# Patient Record
Sex: Female | Born: 1955 | Race: White | Hispanic: No | Marital: Single | Smoking: Never smoker
Health system: Southern US, Community
[De-identification: ages and names within clinical notes are randomized; demographics above are authoritative.]

## PROBLEM LIST (undated history)

## (undated) DIAGNOSIS — I10 Essential (primary) hypertension: Secondary | ICD-10-CM

## (undated) DIAGNOSIS — N200 Calculus of kidney: Secondary | ICD-10-CM

## (undated) HISTORY — PX: BREAST SURGERY: SHX581

## (undated) HISTORY — PX: TUBAL LIGATION: SHX77

---

## 1998-05-15 ENCOUNTER — Other Ambulatory Visit: Admission: RE | Admit: 1998-05-15 | Discharge: 1998-05-15 | Payer: Self-pay | Admitting: Obstetrics and Gynecology

## 1998-09-15 ENCOUNTER — Ambulatory Visit (HOSPITAL_COMMUNITY): Admission: RE | Admit: 1998-09-15 | Discharge: 1998-09-15 | Payer: Self-pay | Admitting: *Deleted

## 1998-10-08 ENCOUNTER — Ambulatory Visit (HOSPITAL_BASED_OUTPATIENT_CLINIC_OR_DEPARTMENT_OTHER): Admission: RE | Admit: 1998-10-08 | Discharge: 1998-10-08 | Payer: Self-pay | Admitting: *Deleted

## 1998-10-15 ENCOUNTER — Ambulatory Visit (HOSPITAL_COMMUNITY): Admission: RE | Admit: 1998-10-15 | Discharge: 1998-10-15 | Payer: Self-pay | Admitting: *Deleted

## 2000-04-11 ENCOUNTER — Other Ambulatory Visit: Admission: RE | Admit: 2000-04-11 | Discharge: 2000-04-11 | Payer: Self-pay | Admitting: Obstetrics and Gynecology

## 2000-11-22 ENCOUNTER — Other Ambulatory Visit: Admission: RE | Admit: 2000-11-22 | Discharge: 2000-11-22 | Payer: Self-pay | Admitting: Obstetrics and Gynecology

## 2003-01-08 ENCOUNTER — Ambulatory Visit (HOSPITAL_COMMUNITY): Admission: RE | Admit: 2003-01-08 | Discharge: 2003-01-08 | Payer: Self-pay | Admitting: Plastic Surgery

## 2003-01-08 ENCOUNTER — Encounter: Payer: Self-pay | Admitting: Plastic Surgery

## 2004-01-01 ENCOUNTER — Emergency Department (HOSPITAL_COMMUNITY): Admission: EM | Admit: 2004-01-01 | Discharge: 2004-01-01 | Payer: Self-pay | Admitting: Emergency Medicine

## 2007-11-10 ENCOUNTER — Emergency Department (HOSPITAL_COMMUNITY): Admission: EM | Admit: 2007-11-10 | Discharge: 2007-11-10 | Payer: Self-pay | Admitting: Emergency Medicine

## 2007-11-19 ENCOUNTER — Emergency Department (HOSPITAL_COMMUNITY): Admission: EM | Admit: 2007-11-19 | Discharge: 2007-11-19 | Payer: Self-pay | Admitting: Emergency Medicine

## 2012-11-17 ENCOUNTER — Encounter (HOSPITAL_BASED_OUTPATIENT_CLINIC_OR_DEPARTMENT_OTHER): Payer: Self-pay | Admitting: Emergency Medicine

## 2012-11-17 ENCOUNTER — Emergency Department (HOSPITAL_BASED_OUTPATIENT_CLINIC_OR_DEPARTMENT_OTHER)
Admission: EM | Admit: 2012-11-17 | Discharge: 2012-11-18 | Disposition: A | Discharge status: Home / Self Care | Payer: BC Managed Care – PPO | Attending: Emergency Medicine | Admitting: Emergency Medicine

## 2012-11-17 DIAGNOSIS — N390 Urinary tract infection, site not specified: Secondary | ICD-10-CM

## 2012-11-17 DIAGNOSIS — R1110 Vomiting, unspecified: Secondary | ICD-10-CM | POA: Insufficient documentation

## 2012-11-17 DIAGNOSIS — N23 Unspecified renal colic: Secondary | ICD-10-CM | POA: Insufficient documentation

## 2012-11-17 DIAGNOSIS — I10 Essential (primary) hypertension: Secondary | ICD-10-CM | POA: Insufficient documentation

## 2012-11-17 DIAGNOSIS — Z79899 Other long term (current) drug therapy: Secondary | ICD-10-CM | POA: Insufficient documentation

## 2012-11-17 HISTORY — DX: Essential (primary) hypertension: I10

## 2012-11-17 LAB — URINALYSIS, ROUTINE W REFLEX MICROSCOPIC
Glucose, UA: NEGATIVE mg/dL
Ketones, ur: NEGATIVE mg/dL
Nitrite: NEGATIVE
Protein, ur: 30 mg/dL — AB
Specific Gravity, Urine: 1.031 — ABNORMAL HIGH (ref 1.005–1.030)
Urobilinogen, UA: 0.2 mg/dL (ref 0.0–1.0)
pH: 5.5 (ref 5.0–8.0)

## 2012-11-17 NOTE — ED Notes (Signed)
Pt c/o Rt sided flank pain radiating around to RLQ onset tonight. Pt also c/o nausea and vomiting.

## 2012-11-18 ENCOUNTER — Emergency Department (HOSPITAL_BASED_OUTPATIENT_CLINIC_OR_DEPARTMENT_OTHER): Payer: BC Managed Care – PPO

## 2012-11-18 MED ORDER — TAMSULOSIN HCL 0.4 MG PO CAPS
0.4000 mg | ORAL_CAPSULE | Freq: Once | ORAL | Status: AC
  Administered 2012-11-18: 0.4 mg via ORAL
  Filled 2012-11-18: qty 1

## 2012-11-18 MED ORDER — CIPROFLOXACIN HCL 500 MG PO TABS
500.0000 mg | ORAL_TABLET | Freq: Once | ORAL | Status: AC
  Administered 2012-11-18: 500 mg via ORAL
  Filled 2012-11-18: qty 1

## 2012-11-18 MED ORDER — HYDROMORPHONE HCL PF 1 MG/ML IJ SOLN
1.0000 mg | Freq: Once | INTRAMUSCULAR | Status: AC
  Administered 2012-11-18: 1 mg via INTRAVENOUS
  Filled 2012-11-18: qty 1

## 2012-11-18 MED ORDER — DEXTROSE 5 % IV SOLN
1.0000 g | INTRAVENOUS | Status: DC
  Administered 2012-11-18: 1 g via INTRAVENOUS
  Filled 2012-11-18: qty 10

## 2012-11-18 MED ORDER — CIPROFLOXACIN HCL 500 MG PO TABS: 500.0000 mg | ORAL_TABLET | Freq: Two times a day (BID) | ORAL | Status: AC

## 2012-11-18 MED ORDER — TAMSULOSIN HCL 0.4 MG PO CAPS: ORAL_CAPSULE | ORAL | Status: DC

## 2012-11-18 MED ORDER — SODIUM CHLORIDE 0.9 % IV SOLN
INTRAVENOUS | Status: DC
  Administered 2012-11-18: 01:00:00 via INTRAVENOUS

## 2012-11-18 MED ORDER — NAPROXEN SODIUM 220 MG PO TABS: ORAL_TABLET | ORAL | Status: DC

## 2012-11-18 MED ORDER — ONDANSETRON HCL 4 MG/2ML IJ SOLN
4.0000 mg | Freq: Once | INTRAMUSCULAR | Status: AC
  Administered 2012-11-18: 4 mg via INTRAVENOUS
  Filled 2012-11-18: qty 2

## 2012-11-18 MED ORDER — HYDROMORPHONE HCL 2 MG PO TABS: 2.0000 mg | ORAL_TABLET | ORAL | Status: DC | PRN

## 2012-11-18 MED ORDER — KETOROLAC TROMETHAMINE 15 MG/ML IJ SOLN
15.0000 mg | Freq: Once | INTRAMUSCULAR | Status: AC
  Administered 2012-11-18: 15 mg via INTRAVENOUS
  Filled 2012-11-18: qty 1

## 2012-11-18 NOTE — ED Notes (Signed)
Pt's acuity increased d/t pain and nausea. MD aware and will see pt next.

## 2012-11-18 NOTE — ED Provider Notes (Addendum)
History     CSN: [id number]  Arrival date & time 11/17/12  2322   First MD Initiated Contact with Patient 11/18/12 0043      Chief Complaint  Patient presents with  . Flank Pain    (Consider location/radiation/quality/duration/timing/severity/associated sxs/prior treatment) HPI This is a 57 year old female who had the sudden onset of right flank pain about 9:30 yesterday evening. The pain is described as aching and like menstrual cramps. It has been moderate to severe at its worst. It has been colicky. It is not as severe it is normal. It radiates to the right lower quadrant of the abdomen, often alternating locations. It is not worse with movement or palpation and is not improved with rest. It is been associated with vomiting but no nausea. She denies fever, chills, dysuria or hematuria. She has no history of nephrolithiasis.  Past Medical History  Diagnosis Date  . Hypertension     History reviewed. No pertinent past surgical history.  No family history on file.  History  Substance Use Topics  . Smoking status: Never Smoker   . Smokeless tobacco: Not on file  . Alcohol Use: No    OB History    Grav Para Term Preterm Abortions TAB SAB Ect Mult Living                  Review of Systems  All other systems reviewed and are negative.    Allergies  Review of patient's allergies indicates no known allergies.  Home Medications   Current Outpatient Rx  Name  Route  Sig  Dispense  Refill  . PHENTERMINE HCL 37.5 MG PO CAPS   Oral   Take 14 mg by mouth as needed.         Marland Kitchen VALSARTAN-HYDROCHLOROTHIAZIDE 320-12.5 MG PO TABS   Oral   Take 1 tablet by mouth daily.           BP 131/101  Pulse 95  Temp 98.6 F (37 C) (Oral)  Resp 18  Ht 5\' 7"  (1.702 m)  Wt 205 lb (92.987 kg)  BMI 32.11 kg/m2  SpO2 98%  Physical Exam General: Well-developed, well-nourished female in no acute distress; appearance consistent with age of record HENT: normocephalic,  atraumatic Eyes: pupils equal round and reactive to light; extraocular muscles intact Neck: supple Heart: regular rate and rhythm; no murmurs, rubs or gallops Lungs: clear to auscultation bilaterally Abdomen: soft; nondistended; nontender; no masses or hepatosplenomegaly; bowel sounds present GU: No flank tenderness Extremities: No deformity; full range of motion; pulses normal Neurologic: Awake, alert and oriented; motor function intact in all extremities and symmetric; no facial droop Skin: Warm and dry Psychiatric: Normal mood and affect    ED Course  Procedures (including critical care time)     MDM   Nursing notes and vitals signs, including pulse oximetry, reviewed.  Summary of this visit's results, reviewed by myself:  Labs:  Results for orders placed during the hospital encounter of 11/17/12 (from the past 24 hour(s))  URINALYSIS, ROUTINE W REFLEX MICROSCOPIC     Status: Abnormal   Collection Time   11/17/12 11:28 PM      Component Value Range   Color, Urine YELLOW  YELLOW   APPearance CLOUDY (*) CLEAR   Specific Gravity, Urine 1.031 (*) 1.005 - 1.030   pH 5.5  5.0 - 8.0   Glucose, UA NEGATIVE  NEGATIVE mg/dL   Hgb urine dipstick LARGE (*) NEGATIVE   Bilirubin Urine SMALL (*) NEGATIVE  Ketones, ur NEGATIVE  NEGATIVE mg/dL   Protein, ur 30 (*) NEGATIVE mg/dL   Urobilinogen, UA 0.2  0.0 - 1.0 mg/dL   Nitrite NEGATIVE  NEGATIVE   Leukocytes, UA LARGE (*) NEGATIVE  URINE MICROSCOPIC-ADD ON     Status: Abnormal   Collection Time   11/17/12 11:28 PM      Component Value Range   Squamous Epithelial / LPF FEW (*) RARE   WBC, UA 21-50  <3 WBC/hpf   RBC / HPF 21-50  <3 RBC/hpf   Bacteria, UA MANY (*) RARE    Imaging Studies: Ct Abdomen Pelvis Wo Contrast  11/18/2012  *RADIOLOGY REPORT*  Clinical Data: Right flank pain  CT ABDOMEN AND PELVIS WITHOUT CONTRAST  Technique:  Multidetector CT imaging of the abdomen and pelvis was performed following the standard  protocol without intravenous contrast.  Comparison: none  Findings: Lung bases are clear.  No pericardial fluid.  Non-IV contrast images demonstrate no focal hepatic lesion.  The gallbladder, pancreas, spleen, adrenal glands are normal.  There is mild pelvicaliectasis on the right and right hydroureter. This is secondary to a partially obstructing calculus in the proximal right ureter measuring 4 mm (image 47).  This right ureteral calculus is at the L4-L5 disc space level and is  faintly evident on the CT scout.  There is several 2 mm calculi within the left kidney.  No left ureteral lithiasis. Tiny 1 mm right renal calculus.  The stomach, small bowel, appendix, and cecum are normal.  Colon rectosigmoid colon are normal.  Abdominal aorta is normal caliber.  No retroperitoneal periportal lymphadenopathy.  No free fluid the pelvis.  No distal ureteral stones bladder stones.  The uterus and ovaries are normal.  No pelvic lymphadenopathy.  IMPRESSION:  1.  Partial obstructing calculus within the mid right ureter with mild obstructive uropathy. 2.  Punctate bilateral nephrolithiasis.   Original Report Authenticated By: Genevive Bi, M.D.    2:11 AM Pain well controlled with IV medications.  Discussed with Dr. Brunilda Payor. He advises Flomax and ciprofloxacin. He will see the patient in his office; she is to call Monday morning.         Hanley Seamen, MD 11/18/12 1610  Hanley Seamen, MD 11/18/12 9604

## 2012-11-19 LAB — URINE CULTURE

## 2012-12-06 ENCOUNTER — Encounter (HOSPITAL_BASED_OUTPATIENT_CLINIC_OR_DEPARTMENT_OTHER): Payer: Self-pay | Admitting: *Deleted

## 2012-12-06 ENCOUNTER — Emergency Department (HOSPITAL_BASED_OUTPATIENT_CLINIC_OR_DEPARTMENT_OTHER)
Admission: EM | Admit: 2012-12-06 | Discharge: 2012-12-06 | Disposition: A | Discharge status: Home / Self Care | Payer: BC Managed Care – PPO | Attending: Emergency Medicine | Admitting: Emergency Medicine

## 2012-12-06 DIAGNOSIS — Z79899 Other long term (current) drug therapy: Secondary | ICD-10-CM | POA: Insufficient documentation

## 2012-12-06 DIAGNOSIS — N200 Calculus of kidney: Secondary | ICD-10-CM

## 2012-12-06 DIAGNOSIS — Z87442 Personal history of urinary calculi: Secondary | ICD-10-CM | POA: Insufficient documentation

## 2012-12-06 DIAGNOSIS — I10 Essential (primary) hypertension: Secondary | ICD-10-CM | POA: Insufficient documentation

## 2012-12-06 HISTORY — DX: Calculus of kidney: N20.0

## 2012-12-06 LAB — URINE MICROSCOPIC-ADD ON

## 2012-12-06 LAB — URINALYSIS, ROUTINE W REFLEX MICROSCOPIC
Ketones, ur: 15 mg/dL — AB
Nitrite: NEGATIVE
Specific Gravity, Urine: 1.022 (ref 1.005–1.030)
Urobilinogen, UA: 0.2 mg/dL (ref 0.0–1.0)
pH: 5.5 (ref 5.0–8.0)

## 2012-12-06 MED ORDER — CIPROFLOXACIN HCL 500 MG PO TABS: 500.0000 mg | ORAL_TABLET | Freq: Two times a day (BID) | ORAL | Status: AC

## 2012-12-06 NOTE — ED Provider Notes (Signed)
History     CSN: [id number]  Arrival date & time 12/06/12  1326   First MD Initiated Contact with Patient 12/06/12 1338      Chief Complaint  Patient presents with  . Flank Pain    (Consider location/radiation/quality/duration/timing/severity/associated sxs/prior treatment) HPI Comments: Patient diagnosed with renal calculus 2 weeks ago here and was told to follow up with Urology.  She has tried to call but [facility] would not give her an appointment.  She has been taking her pain medications and flomax and continues to have the discomfort.  She presents here because she is now having dark/bloody urine and is not sure where to go and what to do.  She denies fevers or chills.  No v/d.  Patient is a 57 y.o. female presenting with flank pain. The history is provided by the patient.  Flank Pain This is a new problem. Episode onset: 2 weeks ago. Episode frequency: intermittently. The problem has been gradually worsening. Nothing aggravates the symptoms. Nothing relieves the symptoms.    Past Medical History  Diagnosis Date  . Hypertension   . Kidney calculus     History reviewed. No pertinent past surgical history.  History reviewed. No pertinent family history.  History  Substance Use Topics  . Smoking status: Never Smoker   . Smokeless tobacco: Not on file  . Alcohol Use: No    OB History    Grav Para Term Preterm Abortions TAB SAB Ect Mult Living                  Review of Systems  Genitourinary: Positive for flank pain.  All other systems reviewed and are negative.    Allergies  Review of patient's allergies indicates no known allergies.  Home Medications   Current Outpatient Rx  Name  Route  Sig  Dispense  Refill  . CIPROFLOXACIN HCL 500 MG PO TABS   Oral   Take 1 tablet (500 mg total) by mouth 2 (two) times daily.   14 tablet   0   . HYDROMORPHONE HCL 2 MG PO TABS   Oral   Take 1 tablet (2 mg total) by mouth every 4 (four) hours as needed for pain.  30 tablet   0   . NAPROXEN SODIUM 220 MG PO TABS      Take 2 tablets twice daily until stone passes.         Marland Kitchen PHENTERMINE HCL 37.5 MG PO CAPS   Oral   Take 14 mg by mouth as needed.         Marland Kitchen TAMSULOSIN HCL 0.4 MG PO CAPS      Take 1 capsule daily until stone passes.   30 capsule   0   . VALSARTAN-HYDROCHLOROTHIAZIDE 320-12.5 MG PO TABS   Oral   Take 1 tablet by mouth daily.           BP 145/97  Pulse 94  Temp 97.6 F (36.4 C) (Oral)  Resp 16  Ht 5\' 7"  (1.702 m)  Wt 204 lb (92.534 kg)  BMI 31.95 kg/m2  SpO2 100%  Physical Exam  Nursing note and vitals reviewed. Constitutional: She is oriented to person, place, and time. She appears well-developed and well-nourished. No distress.  HENT:  Head: Normocephalic and atraumatic.  Neck: Normal range of motion. Neck supple.  Cardiovascular: Normal rate and regular rhythm.  Exam reveals no gallop and no friction rub.   No murmur heard. Pulmonary/Chest: Effort normal and breath sounds normal.  No respiratory distress. She has no wheezes.  Abdominal: Soft. Bowel sounds are normal. She exhibits no distension. There is no tenderness.       There is mild right-sided cva ttp.    Musculoskeletal: Normal range of motion.  Neurological: She is alert and oriented to person, place, and time.  Skin: Skin is warm and dry. She is not diaphoretic.    ED Course  Procedures (including critical care time)   Labs Reviewed  URINALYSIS, ROUTINE W REFLEX MICROSCOPIC   No results found.   No diagnosis found.    MDM  I have spoken with Dr. Laverle Patter from Urology about this patient.  He would like to have her followed up in the next 2 days to be seen.  I have also spoken with Zella Ball at [facility] who will make arrangements for this appointment.  Mrs. Ebarb is to call her tomorrow.          Geoffery Lyons, MD 12/06/12 1451

## 2012-12-06 NOTE — ED Notes (Signed)
Pt c/o right flank pain x 1 day seen here 2 weeks ago for same dx kidney stones

## 2020-01-14 ENCOUNTER — Emergency Department (HOSPITAL_BASED_OUTPATIENT_CLINIC_OR_DEPARTMENT_OTHER): Payer: No Typology Code available for payment source

## 2020-01-14 ENCOUNTER — Other Ambulatory Visit: Payer: Self-pay

## 2020-01-14 ENCOUNTER — Emergency Department (HOSPITAL_BASED_OUTPATIENT_CLINIC_OR_DEPARTMENT_OTHER)
Admission: EM | Admit: 2020-01-14 | Discharge: 2020-01-14 | Disposition: A | Discharge status: Home / Self Care | Payer: No Typology Code available for payment source | Attending: Emergency Medicine | Admitting: Emergency Medicine

## 2020-01-14 ENCOUNTER — Encounter (HOSPITAL_BASED_OUTPATIENT_CLINIC_OR_DEPARTMENT_OTHER): Payer: Self-pay | Admitting: *Deleted

## 2020-01-14 DIAGNOSIS — R42 Dizziness and giddiness: Secondary | ICD-10-CM

## 2020-01-14 DIAGNOSIS — G44209 Tension-type headache, unspecified, not intractable: Secondary | ICD-10-CM | POA: Diagnosis not present

## 2020-01-14 DIAGNOSIS — R519 Headache, unspecified: Secondary | ICD-10-CM

## 2020-01-14 DIAGNOSIS — I10 Essential (primary) hypertension: Secondary | ICD-10-CM | POA: Diagnosis not present

## 2020-01-14 DIAGNOSIS — Z79899 Other long term (current) drug therapy: Secondary | ICD-10-CM | POA: Insufficient documentation

## 2020-01-14 LAB — CBC WITH DIFFERENTIAL/PLATELET
Abs Immature Granulocytes: 0.01 10*3/uL (ref 0.00–0.07)
Basophils Absolute: 0 10*3/uL (ref 0.0–0.1)
Basophils Relative: 1 %
Eosinophils Absolute: 0.2 10*3/uL (ref 0.0–0.5)
Eosinophils Relative: 4 %
HCT: 46.7 % — ABNORMAL HIGH (ref 36.0–46.0)
Hemoglobin: 15.5 g/dL — ABNORMAL HIGH (ref 12.0–15.0)
Immature Granulocytes: 0 %
Lymphocytes Relative: 28 %
Lymphs Abs: 1.6 10*3/uL (ref 0.7–4.0)
MCH: 32.7 pg (ref 26.0–34.0)
MCHC: 33.2 g/dL (ref 30.0–36.0)
MCV: 98.5 fL (ref 80.0–100.0)
Monocytes Absolute: 0.8 10*3/uL (ref 0.1–1.0)
Monocytes Relative: 15 %
Neutro Abs: 3.1 10*3/uL (ref 1.7–7.7)
Neutrophils Relative %: 52 %
Platelets: 261 10*3/uL (ref 150–400)
RBC: 4.74 MIL/uL (ref 3.87–5.11)
RDW: 12.5 % (ref 11.5–15.5)
WBC: 5.8 10*3/uL (ref 4.0–10.5)
nRBC: 0 % (ref 0.0–0.2)

## 2020-01-14 LAB — COMPREHENSIVE METABOLIC PANEL
ALT: 23 U/L (ref 0–44)
AST: 19 U/L (ref 15–41)
Albumin: 3.8 g/dL (ref 3.5–5.0)
Alkaline Phosphatase: 50 U/L (ref 38–126)
Anion gap: 7 (ref 5–15)
BUN: 17 mg/dL (ref 8–23)
CO2: 30 mmol/L (ref 22–32)
Calcium: 9.2 mg/dL (ref 8.9–10.3)
Chloride: 101 mmol/L (ref 98–111)
Creatinine, Ser: 0.99 mg/dL (ref 0.44–1.00)
GFR calc Af Amer: >60 mL/min (ref >60)
GFR calc non Af Amer: >60 mL/min (ref >60)
Glucose, Bld: 94 mg/dL (ref 70–99)
Potassium: 3.3 mmol/L — ABNORMAL LOW (ref 3.5–5.1)
Sodium: 138 mmol/L (ref 135–145)
Total Bilirubin: 1 mg/dL (ref 0.3–1.2)
Total Protein: 7 g/dL (ref 6.5–8.1)

## 2020-01-14 MED ORDER — POTASSIUM CHLORIDE CRYS ER 20 MEQ PO TBCR
40.0000 meq | EXTENDED_RELEASE_TABLET | Freq: Once | ORAL | Status: AC
  Administered 2020-01-14: 40 meq via ORAL
  Filled 2020-01-14: qty 2

## 2020-01-14 MED ORDER — SODIUM CHLORIDE 0.9 % IV BOLUS
1000.0000 mL | Freq: Once | INTRAVENOUS | Status: AC
  Administered 2020-01-14: 1000 mL via INTRAVENOUS

## 2020-01-14 MED ORDER — IOHEXOL 350 MG/ML SOLN
100.0000 mL | Freq: Once | INTRAVENOUS | Status: AC
  Administered 2020-01-14: 100 mL via INTRAVENOUS

## 2020-01-14 NOTE — Discharge Instructions (Signed)
If you develop continued, recurrent, or worsening headache, fever, neck stiffness, vomiting, blurry or double vision, weakness or numbness in your arms or legs, trouble speaking, or any other new/concerning symptoms then return to the ER for evaluation.  

## 2020-01-14 NOTE — ED Triage Notes (Signed)
States BP has been elevated for the last few days, headaches, dizziness, n/v/d, blurry vision and perf vision has been 'off'

## 2020-01-14 NOTE — ED Provider Notes (Signed)
[facility] EMERGENCY DEPARTMENT Provider Note   CSN: [id number] Arrival date & time: 01/14/20  1526     History Chief Complaint  Patient presents with  . Dizziness    Debra Mcmahon is a 64 y.o. female.  HPI 64 year old female presents with dizziness and headache.  Symptoms started on 3/6.  She woke up feeling not well including a mild headache.  Symptoms have progressively worsened.  Yesterday morning she was watching TV and noticed that it was blurry.  There was no double vision.  Lasted a couple hours.  Headache has progressively worsened and is diffuse.  She does have some morning congestion.  No cough, shortness of breath.  Feels like her neck hurts a little bit but no stiffness.  She has also felt dizzy which she describes as a feeling of faintness.  This is a diffuse and constant feeling.  Does not feel like things are moving or she is off balance.  She vomited last night, but now has no nausea.  The headache is better now and she describes it as mild.  No chest pain.  No weakness or numbness.  No current vision changes.  Felt like it might be her blood pressure because she has gotten headaches before with blood pressure and when she checked her blood pressure throughout the weekend it had gotten to a max of 156 systolic which is high for her.   Past Medical History:  Diagnosis Date  . Hypertension   . Kidney calculus     There are no problems to display for this patient.   Past Surgical History:  Procedure Laterality Date  . BREAST SURGERY    . CESAREAN SECTION    . TUBAL LIGATION       OB History   No obstetric history on file.     History reviewed. No pertinent family history.  Social History   Tobacco Use  . Smoking status: Never Smoker  . Smokeless tobacco: Never Used  Substance Use Topics  . Alcohol use: Yes  . Drug use: No    Home Medications Prior to Admission medications   Medication Sig Start Date End Date Taking? Authorizing Provider   calcium-vitamin D (OSCAL WITH D) 500-200 MG-UNIT per tablet Take 1 tablet by mouth daily. Does not take daily    [provider]  ciprofloxacin (CIPRO) 500 MG tablet Take 1 tablet (500 mg total) by mouth 2 (two) times daily. 11/18/12   Molpus, John, MD  ciprofloxacin (CIPRO) 500 MG tablet Take 1 tablet (500 mg total) by mouth 2 (two) times daily. One po bid x 7 days 12/06/12   Geoffery Lyons, MD  fish oil-omega-3 fatty acids 1000 MG capsule Take 1 g by mouth daily. Does not use daily    [provider]  HYDROmorphone (DILAUDID) 2 MG tablet Take 2 mg by mouth every 4 (four) hours as needed. Take with 2 aleve for pain    [provider]  Multiple Vitamins-Minerals (MULTIVITAMIN WITH MINERALS) tablet Take 1 tablet by mouth daily.    [provider]  naproxen sodium (ANAPROX) 220 MG tablet Take 440 mg by mouth 2 (two) times daily with a meal. Take every 4 hours with the hydromorphone    [provider]  Tamsulosin HCl (FLOMAX) 0.4 MG CAPS Take 0.4 mg by mouth daily after breakfast.    [provider]  valsartan-hydrochlorothiazide (DIOVAN-HCT) 320-12.5 MG per tablet Take 1 tablet by mouth daily.    [provider]  Allergies    Patient has no known allergies.  Review of Systems   Review of Systems  Constitutional: Negative for fever.  Respiratory: Negative for cough and shortness of breath.   Cardiovascular: Negative for chest pain.  Gastrointestinal: Positive for vomiting. Negative for abdominal pain.  Musculoskeletal: Negative for gait problem and neck stiffness.  Neurological: Positive for light-headedness and headaches. Negative for weakness and numbness.  All other systems reviewed and are negative.   Physical Exam Updated Vital Signs BP (!) 144/89 (BP Location: Left Arm)   Pulse 72   Temp 98.1 F (36.7 C) (Oral)   Resp 18   Ht 5\' 8"  (1.727 m)   Wt 109.3 kg   SpO2 97%   BMI 36.64 kg/m   Physical Exam Vitals and  nursing note reviewed.  Constitutional:      General: She is not in acute distress.    Appearance: She is well-developed. She is not ill-appearing or diaphoretic.  HENT:     Head: Normocephalic and atraumatic.     Right Ear: External ear normal.     Left Ear: External ear normal.     Nose: Nose normal.  Eyes:     General:        Right eye: No discharge.        Left eye: No discharge.     Extraocular Movements: Extraocular movements intact.     Pupils: Pupils are equal, round, and reactive to light.  Cardiovascular:     Rate and Rhythm: Normal rate and regular rhythm.     Heart sounds: Normal heart sounds.  Pulmonary:     Effort: Pulmonary effort is normal.     Breath sounds: Normal breath sounds.  Abdominal:     Palpations: Abdomen is soft.     Tenderness: There is no abdominal tenderness.  Musculoskeletal:     Cervical back: Normal range of motion. No rigidity.  Skin:    General: Skin is warm and dry.  Neurological:     Mental Status: She is alert.     Comments: CN 3-12 grossly intact. 5/5 strength in all 4 extremities. Grossly normal sensation. Normal finger to nose. Normal gait  Psychiatric:        Mood and Affect: Mood is not anxious.     ED Results / Procedures / Treatments   Labs (all labs ordered are listed, but only abnormal results are displayed) Labs Reviewed  COMPREHENSIVE METABOLIC PANEL - Abnormal; Notable for the following components:      Result Value   Potassium 3.3 (*)    All other components within normal limits  CBC WITH DIFFERENTIAL/PLATELET - Abnormal; Notable for the following components:   Hemoglobin 15.5 (*)    HCT 46.7 (*)    All other components within normal limits    EKG EKG Interpretation  Date/Time:  Monday January 14 2020 15:46:31 EST Ventricular Rate:  83 PR Interval:  136 QRS Duration: 80 QT Interval:  376 QTC Calculation: 441 R Axis:   23 Text Interpretation: Normal sinus rhythm Low voltage QRS Cannot rule out Anterior  infarct , age undetermined Abnormal ECG no significant change since 1999 Confirmed by 2000 [phone]) on 01/14/2020 4:08:27 PM   Radiology CT Angio Head W or Wo Contrast  Result Date: 01/14/2020 CLINICAL DATA:  Dizziness, vision change EXAM: CT ANGIOGRAPHY HEAD AND NECK TECHNIQUE: Multidetector CT imaging of the head and neck was performed using the standard protocol during bolus administration of intravenous contrast. Multiplanar CT image  reconstructions and MIPs were obtained to evaluate the vascular anatomy. Carotid stenosis measurements (when applicable) are obtained utilizing NASCET criteria, using the distal internal carotid diameter as the denominator. CONTRAST:  145mL OMNIPAQUE IOHEXOL 350 MG/ML SOLN COMPARISON:  None. FINDINGS: CT HEAD Brain: There is no acute intracranial hemorrhage, mass-effect, or edema. Gray-white differentiation is preserved. There is no extra-axial fluid collection. Ventricles and sulci are within normal limits in size and configuration. Vascular: No hyperdense vessel or unexpected calcification. Skull: Calvarium is unremarkable. Sinuses/Orbits: No acute finding. Other: None. Review of the MIP images confirms the above findings CTA NECK Aortic arch: Great vessel origins are patent. Right carotid system: Patent. No measurable stenosis or evidence dissection. Left carotid system: Patent. No measurable stenosis or evidence of dissection. Vertebral arteries: Patent and codominant. No measurable stenosis or evidence of dissection. Skeleton: Degenerative changes of the cervical spine, greatest at C5-C6 and C6-C7. Other neck: No mass or adenopathy. Upper chest: No apical lung mass. Review of the MIP images confirms the above findings CTA HEAD Anterior circulation: Intracranial internal carotid arteries are patent. Anterior and middle cerebral arteries are patent. Posterior circulation: Intracranial vertebral arteries are patent. Basilar artery is patent. Posterior cerebral  arteries are patent. Probable small caliber left posterior communicating artery. Venous sinuses: As permitted by contrast timing, patent. Review of the MIP images confirms the above findings IMPRESSION: No acute intracranial abnormality. No large vessel occlusion, hemodynamically significant stenosis, or evidence of dissection. Electronically Signed   By: Macy Mis M.D.   On: 01/14/2020 19:13   CT Angio Neck W and/or Wo Contrast  Result Date: 01/14/2020 CLINICAL DATA:  Dizziness, vision change EXAM: CT ANGIOGRAPHY HEAD AND NECK TECHNIQUE: Multidetector CT imaging of the head and neck was performed using the standard protocol during bolus administration of intravenous contrast. Multiplanar CT image reconstructions and MIPs were obtained to evaluate the vascular anatomy. Carotid stenosis measurements (when applicable) are obtained utilizing NASCET criteria, using the distal internal carotid diameter as the denominator. CONTRAST:  184mL OMNIPAQUE IOHEXOL 350 MG/ML SOLN COMPARISON:  None. FINDINGS: CT HEAD Brain: There is no acute intracranial hemorrhage, mass-effect, or edema. Gray-white differentiation is preserved. There is no extra-axial fluid collection. Ventricles and sulci are within normal limits in size and configuration. Vascular: No hyperdense vessel or unexpected calcification. Skull: Calvarium is unremarkable. Sinuses/Orbits: No acute finding. Other: None. Review of the MIP images confirms the above findings CTA NECK Aortic arch: Great vessel origins are patent. Right carotid system: Patent. No measurable stenosis or evidence dissection. Left carotid system: Patent. No measurable stenosis or evidence of dissection. Vertebral arteries: Patent and codominant. No measurable stenosis or evidence of dissection. Skeleton: Degenerative changes of the cervical spine, greatest at C5-C6 and C6-C7. Other neck: No mass or adenopathy. Upper chest: No apical lung mass. Review of the MIP images confirms the above  findings CTA HEAD Anterior circulation: Intracranial internal carotid arteries are patent. Anterior and middle cerebral arteries are patent. Posterior circulation: Intracranial vertebral arteries are patent. Basilar artery is patent. Posterior cerebral arteries are patent. Probable small caliber left posterior communicating artery. Venous sinuses: As permitted by contrast timing, patent. Review of the MIP images confirms the above findings IMPRESSION: No acute intracranial abnormality. No large vessel occlusion, hemodynamically significant stenosis, or evidence of dissection. Electronically Signed   By: Macy Mis M.D.   On: 01/14/2020 19:13    Procedures Procedures (including critical care time)  Medications Ordered in ED Medications  sodium chloride 0.9 % bolus 1,000 mL (0 mLs  Intravenous Stopped 01/14/20 1942)  potassium chloride SA (KLOR-CON) CR tablet 40 mEq (40 mEq Oral Given 01/14/20 1831)  iohexol (OMNIPAQUE) 350 MG/ML injection 100 mL (100 mLs Intravenous Contrast Given 01/14/20 1843)    ED Course  I have reviewed the triage vital signs and the nursing notes.  Pertinent labs & imaging results that were available during my care of the patient were reviewed by me and considered in my medical decision making (see chart for details).    MDM Rules/Calculators/A&P                      Patient's headache is already better.  Dizziness/faintness feels better after fluids.  Mild hypokalemia but otherwise labs reassuring.  CT angiography obtained to help rule out obvious dissection, aneurysm, etc.  However her story is more of a slowly progressive headache that is inconsistent with subarachnoid hemorrhage.  She feels better now.  She is ambulatory.  My suspicion of infarct such as cerebellar infarct is pretty low.  Offered to transfer for MRI but she declines.  I think is pretty reasonable.  Advise follow-up with PCP and return precautions. Final Clinical Impression(s) / ED Diagnoses Final  diagnoses:  Dizziness  Acute nonintractable headache, unspecified headache type    Rx / DC Orders ED Discharge Orders    None       Pricilla Loveless, MD 01/15/20 [phone]

## 2020-01-14 NOTE — ED Notes (Signed)
Was seen at PCP office today and sent here, CBG 157 @ PCP office, no other tests done.

## 2020-05-25 ENCOUNTER — Other Ambulatory Visit: Payer: Self-pay

## 2020-05-25 DIAGNOSIS — M79662 Pain in left lower leg: Secondary | ICD-10-CM | POA: Insufficient documentation

## 2020-05-25 DIAGNOSIS — Z79899 Other long term (current) drug therapy: Secondary | ICD-10-CM | POA: Insufficient documentation

## 2020-05-25 DIAGNOSIS — I10 Essential (primary) hypertension: Secondary | ICD-10-CM | POA: Insufficient documentation

## 2020-05-25 NOTE — ED Triage Notes (Signed)
Per pt she said she thought she was bitten by an ant today and then after dinner she noticed that it was burring and itching. Pt said it is warm to touch. There is an area on her left lower leg that is red. Warm to the touch.

## 2020-05-26 ENCOUNTER — Emergency Department (HOSPITAL_COMMUNITY)
Admission: EM | Admit: 2020-05-26 | Discharge: 2020-05-26 | Disposition: A | Discharge status: Home / Self Care | Payer: No Typology Code available for payment source | Attending: Emergency Medicine | Admitting: Emergency Medicine

## 2020-05-26 ENCOUNTER — Other Ambulatory Visit: Payer: Self-pay

## 2020-05-26 DIAGNOSIS — M79605 Pain in left leg: Secondary | ICD-10-CM

## 2020-05-26 MED ORDER — ACETAMINOPHEN 325 MG PO TABS
650.0000 mg | ORAL_TABLET | Freq: Once | ORAL | Status: AC
  Administered 2020-05-26: 650 mg via ORAL
  Filled 2020-05-26: qty 2

## 2020-05-26 MED ORDER — DOXYCYCLINE HYCLATE 100 MG PO TABS: 100.0000 mg | ORAL_TABLET | Freq: Two times a day (BID) | ORAL | 0 refills | Status: AC

## 2020-05-26 MED ORDER — NAPROXEN 375 MG PO TABS: 375.0000 mg | ORAL_TABLET | Freq: Two times a day (BID) | ORAL | 0 refills | Status: AC

## 2020-05-26 MED ORDER — DOXYCYCLINE HYCLATE 100 MG PO TABS
100.0000 mg | ORAL_TABLET | Freq: Once | ORAL | Status: AC
  Administered 2020-05-26: 100 mg via ORAL
  Filled 2020-05-26: qty 1

## 2020-05-26 NOTE — Discharge Instructions (Signed)
Continue to keep your leg elevated when able.  Take the antibiotics as prescribed.  Follow-up with your doctor to be rechecked in a few days if you are not noticing any improvement.  Return to the ED if you start having high fevers, chest pain, shortness of breath, or redness and swelling that is extending up towards your thigh.

## 2020-05-26 NOTE — ED Provider Notes (Signed)
[facility] EMERGENCY DEPARTMENT Provider Note   CSN: [id number] Arrival date & time: 05/25/20  2345     History Chief Complaint  Patient presents with  . Insect Bite    Debra Mcmahon is a 64 y.o. female.  HPI   Patient states she started having pain in her left leg yesterday.  May have occurred in the early morning or afternoon.  She felt like she might have been stung by something.  Initially she noticed a quarter sized area that was red in the front of her left shin.  It was stinging and painful and also itchy.  As the evening progressed the pain and redness and swelling increased.  Became more tender.  Patient had significant discomfort by the end of the evening.  She was alarmed by the increasing size of the redness as well as the discomfort so she came to the ED.  Patient was worried about a possible infection versus insect bite versus blood clot.  Patient does have cats but does not recall being scratched although that is certainly possible.  She denies any fevers or chills.  No chest pain or shortness of breath.  No other complaints.  Past Medical History:  Diagnosis Date  . Hypertension   . Kidney calculus     There are no problems to display for this patient.   Past Surgical History:  Procedure Laterality Date  . BREAST SURGERY    . CESAREAN SECTION    . TUBAL LIGATION       OB History   No obstetric history on file.     No family history on file.  Social History   Tobacco Use  . Smoking status: Never Smoker  . Smokeless tobacco: Never Used  Substance Use Topics  . Alcohol use: Yes  . Drug use: No    Home Medications Prior to Admission medications   Medication Sig Start Date End Date Taking? Authorizing Provider  calcium-vitamin D (OSCAL WITH D) 500-200 MG-UNIT per tablet Take 1 tablet by mouth daily. Does not take daily    [provider]  ciprofloxacin (CIPRO) 500 MG tablet Take 1 tablet (500 mg total) by mouth 2 (two)  times daily. 11/18/12   Molpus, John, MD  ciprofloxacin (CIPRO) 500 MG tablet Take 1 tablet (500 mg total) by mouth 2 (two) times daily. One po bid x 7 days 12/06/12   Geoffery Lyons, MD  doxycycline (VIBRA-TABS) 100 MG tablet Take 1 tablet (100 mg total) by mouth 2 (two) times daily. 05/26/20   Linwood Dibbles, MD  fish oil-omega-3 fatty acids 1000 MG capsule Take 1 g by mouth daily. Does not use daily    [provider]  HYDROmorphone (DILAUDID) 2 MG tablet Take 2 mg by mouth every 4 (four) hours as needed. Take with 2 aleve for pain    [provider]  Multiple Vitamins-Minerals (MULTIVITAMIN WITH MINERALS) tablet Take 1 tablet by mouth daily.    [provider]  naproxen (NAPROSYN) 375 MG tablet Take 1 tablet (375 mg total) by mouth 2 (two) times daily. 05/26/20   Linwood Dibbles, MD  naproxen sodium (ANAPROX) 220 MG tablet Take 440 mg by mouth 2 (two) times daily with a meal. Take every 4 hours with the hydromorphone    [provider]  Tamsulosin HCl (FLOMAX) 0.4 MG CAPS Take 0.4 mg by mouth daily after breakfast.    [provider]  valsartan-hydrochlorothiazide (DIOVAN-HCT) 320-12.5 MG per tablet Take 1 tablet  by mouth daily.    [provider]    Allergies    Patient has no known allergies.  Review of Systems   Review of Systems  All other systems reviewed and are negative.   Physical Exam Updated Vital Signs BP (!) 134/93 (BP Location: Right Arm)   Pulse 97   Temp 98 F (36.7 C) (Oral)   Resp 18   SpO2 96%   Physical Exam Vitals and nursing note reviewed.  Constitutional:      General: She is not in acute distress.    Appearance: She is well-developed.  HENT:     Head: Normocephalic and atraumatic.     Right Ear: External ear normal.     Left Ear: External ear normal.  Eyes:     General: No scleral icterus.       Right eye: No discharge.        Left eye: No discharge.     Conjunctiva/sclera: Conjunctivae normal.  Neck:      Trachea: No tracheal deviation.  Cardiovascular:     Rate and Rhythm: Normal rate.  Pulmonary:     Effort: Pulmonary effort is normal. No respiratory distress.     Breath sounds: No stridor.  Abdominal:     General: There is no distension.  Musculoskeletal:        General: Swelling and tenderness present. No deformity.     Cervical back: Neck supple.     Left lower leg: Edema present.     Comments: Erythema with edema over the anterior aspect of the left shin, no lymphangitic streaking, no obvious wounds, no calf tenderness, no Achilles defect  Skin:    General: Skin is warm and dry.     Findings: No rash.  Neurological:     Mental Status: She is alert.     Cranial Nerves: Cranial nerve deficit: no gross deficits.     ED Results / Procedures / Treatments   Labs (all labs ordered are listed, but only abnormal results are displayed) Labs Reviewed - No data to display  EKG None  Radiology No results found.  Procedures Procedures (including critical care time)  Medications Ordered in ED Medications  doxycycline (VIBRA-TABS) tablet 100 mg (has no administration in time range)  acetaminophen (TYLENOL) tablet 650 mg (has no administration in time range)    ED Course  I have reviewed the triage vital signs and the nursing notes.  Pertinent labs & imaging results that were available during my care of the patient were reviewed by me and considered in my medical decision making (see chart for details).    MDM Rules/Calculators/A&P                          Patient symptoms are most likely related to either an insect bite versus an early cellulitis.  Superficial thrombophlebitis is also a possibility.  I have low suspicion for DVT.  Considering the increasing redness and swelling I will start on a course of antibiotics.  Explained to the patient if this is a staying versus a superficial venous thrombosis this will improve on its own over a few days.  I have a low suspicion for  DVT and do not feel that ultrasound is necessary.  Discussed monitoring this closely.  If she has fevers chills increased swelling, chest pain or shortness of breath she should return to the DD.  Otherwise follow-up with your doctor to be rechecked if she  is not noticing improvement in a few days. Final Clinical Impression(s) / ED Diagnoses Final diagnoses:  Pain of left lower extremity    Rx / DC Orders ED Discharge Orders         Ordered    doxycycline (VIBRA-TABS) 100 MG tablet  2 times daily     Discontinue  Reprint     05/26/20 0825    naproxen (NAPROSYN) 375 MG tablet  2 times daily     Discontinue  Reprint     05/26/20 0825           Linwood Dibbles, MD 05/26/20 [phone]

## 2021-06-29 ENCOUNTER — Other Ambulatory Visit: Payer: Self-pay

## 2021-06-29 ENCOUNTER — Other Ambulatory Visit (HOSPITAL_COMMUNITY): Payer: Self-pay | Admitting: Sports Medicine

## 2021-06-29 ENCOUNTER — Ambulatory Visit (HOSPITAL_COMMUNITY)
Admission: RE | Admit: 2021-06-29 | Discharge: 2021-06-29 | Disposition: A | Discharge status: Home / Self Care | Payer: No Typology Code available for payment source | Source: Ambulatory Visit | Attending: Cardiology | Admitting: Cardiology

## 2021-06-29 DIAGNOSIS — M79605 Pain in left leg: Secondary | ICD-10-CM | POA: Diagnosis not present

## 2021-06-29 DIAGNOSIS — M79661 Pain in right lower leg: Secondary | ICD-10-CM

## 2021-06-29 DIAGNOSIS — M7989 Other specified soft tissue disorders: Secondary | ICD-10-CM

## 2022-01-16 IMAGING — CT CT ANGIO NECK
1 of 11 series · 6 of 33 positions shown · IV contrast (Omnipaque)
Comparison: None.

CLINICAL DATA: Dizziness, vision change

EXAM:
CT ANGIOGRAPHY HEAD AND NECK
TECHNIQUE: Multidetector CT imaging of the head and neck was performed using
the standard protocol during bolus administration of intravenous
contrast. Multiplanar CT image reconstructions and MIPs were
obtained to evaluate the vascular anatomy. Carotid stenosis
measurements (when applicable) are obtained utilizing NASCET
criteria, using the distal internal carotid diameter as the
denominator.
CONTRAST:  100mL OMNIPAQUE IOHEXOL 350 MG/ML SOLN

[Series 11: axial thin · axial · 0.39mm/px · z∈[-154,+85]mm · 6 of 335 slices shown]
[im 48/335  soft-tissue]
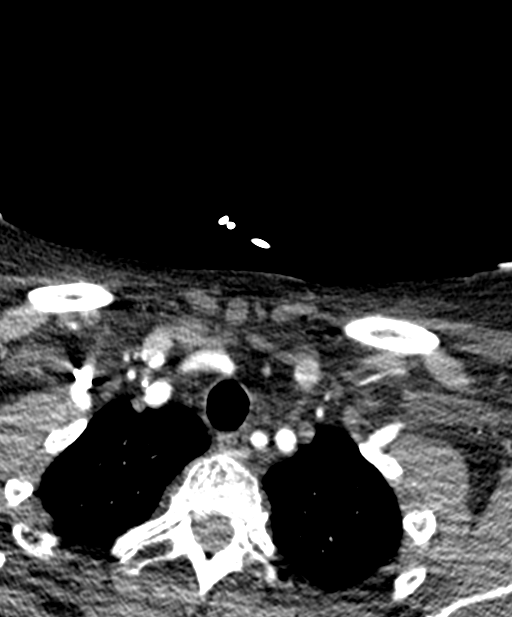
[im 96/335  bone]
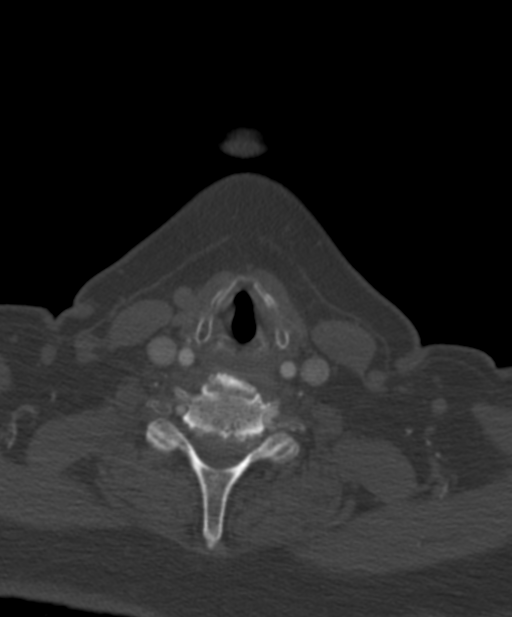
[im 144/335  soft-tissue]
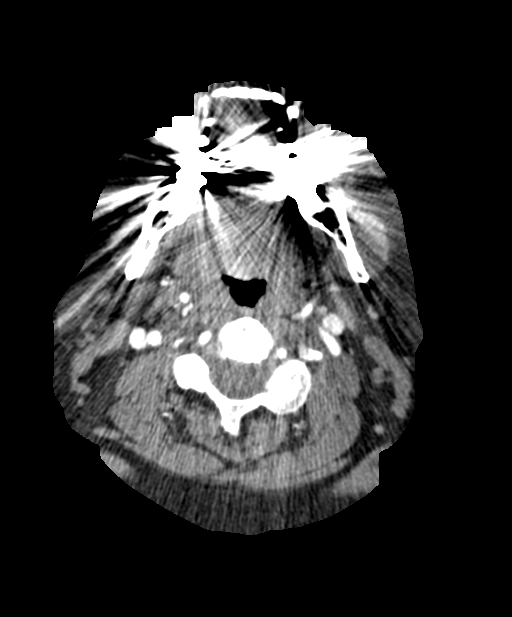
[im 191/335  bone]
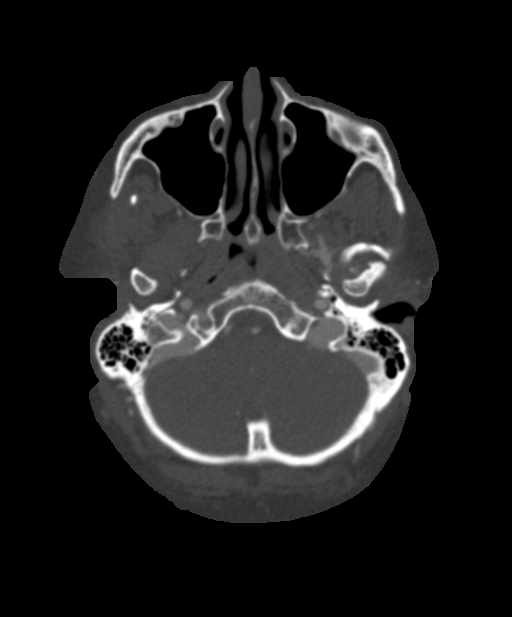
[im 239/335  soft-tissue]
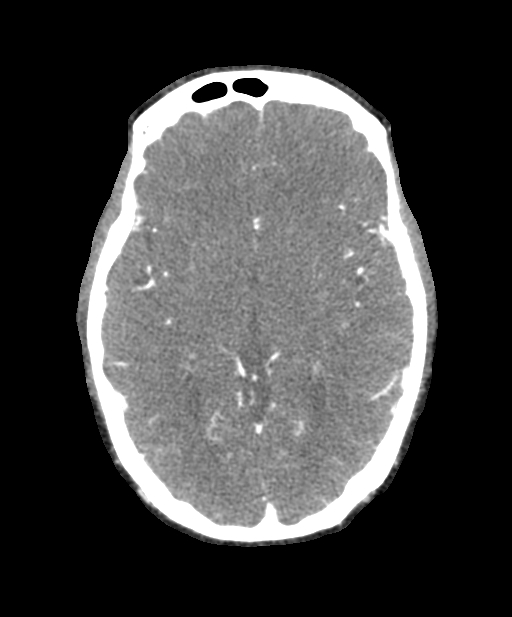
[im 287/335  bone]
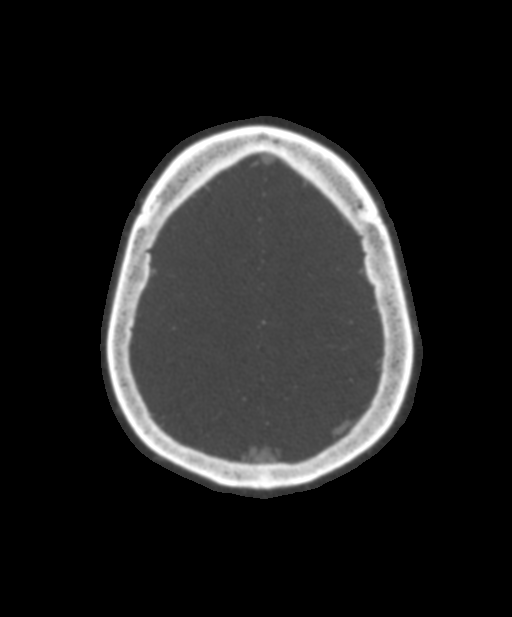

[6 of 33 positions shown; findings below may reference images not displayed]

FINDINGS: CT HEAD

Brain: There is no acute intracranial hemorrhage, mass-effect, or
edema. Gray-white differentiation is preserved. There is no
extra-axial fluid collection. Ventricles and sulci are within normal
limits in size and configuration.

Vascular: No hyperdense vessel or unexpected calcification.

Skull: Calvarium is unremarkable.

Sinuses/Orbits: No acute finding.

Other: None.

Review of the MIP images confirms the above findings

CTA NECK

Aortic arch: Great vessel origins are patent.

Right carotid system: Patent. No measurable stenosis or evidence
dissection.

Left carotid system: Patent. No measurable stenosis or evidence of
dissection.

Vertebral arteries: Patent and codominant. No measurable stenosis or
evidence of dissection.

Skeleton: Degenerative changes of the cervical spine, greatest at
C5-C6 and C6-C7.

Other neck: No mass or adenopathy.

Upper chest: No apical lung mass.

Review of the MIP images confirms the above findings

CTA HEAD

Anterior circulation: Intracranial internal carotid arteries are
patent. Anterior and middle cerebral arteries are patent.

Posterior circulation: Intracranial vertebral arteries are patent.
Basilar artery is patent. Posterior cerebral arteries are patent.
Probable small caliber left posterior communicating artery.

Venous sinuses: As permitted by contrast timing, patent.

Review of the MIP images confirms the above findings
IMPRESSION: No acute intracranial abnormality.

No large vessel occlusion, hemodynamically significant stenosis, or
evidence of dissection.

## 2022-01-16 IMAGING — CT CT ANGIO HEAD
1 of 13 series · 3 of 33 positions shown · IV contrast (omnipaque)
Comparison: None.

CLINICAL DATA: Dizziness, vision change

EXAM:
CT ANGIOGRAPHY HEAD AND NECK
TECHNIQUE: Multidetector CT imaging of the head and neck was performed using
the standard protocol during bolus administration of intravenous
contrast. Multiplanar CT image reconstructions and MIPs were
obtained to evaluate the vascular anatomy. Carotid stenosis
measurements (when applicable) are obtained utilizing NASCET
criteria, using the distal internal carotid diameter as the
denominator.
CONTRAST:  100mL OMNIPAQUE IOHEXOL 350 MG/ML SOLN

[Series 11: axial thin · axial · 0.39mm/px · z∈[-201,+133]mm · 3 of 335 slices shown]
[im 1/335  soft-tissue]
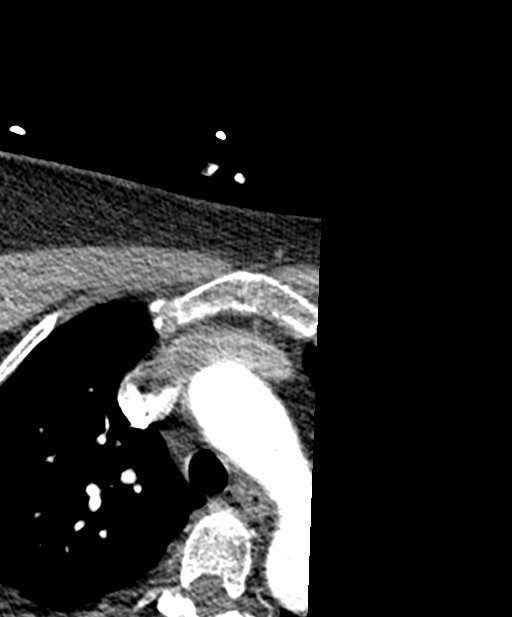
[im 168/335  bone]
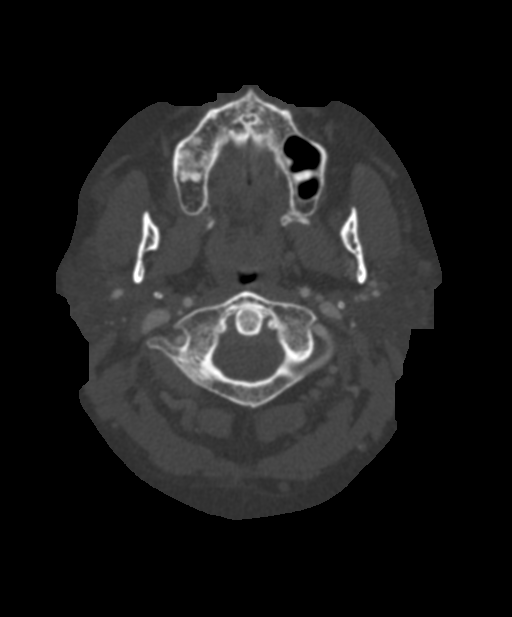
[im 335/335  soft-tissue]
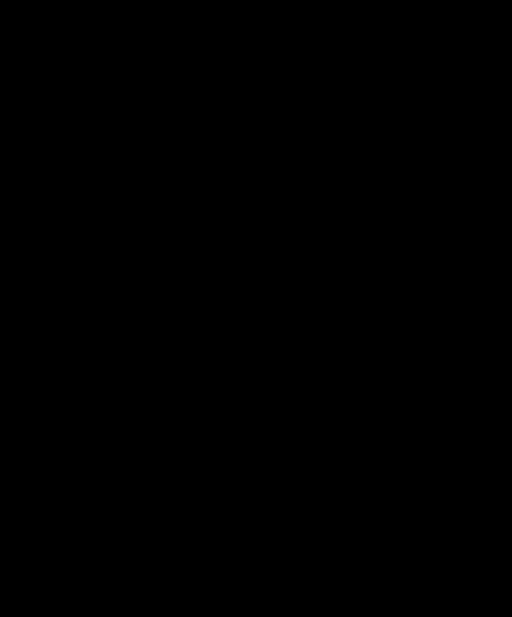

[3 of 33 positions shown; findings below may reference images not displayed]

FINDINGS: CT HEAD

Brain: There is no acute intracranial hemorrhage, mass-effect, or
edema. Gray-white differentiation is preserved. There is no
extra-axial fluid collection. Ventricles and sulci are within normal
limits in size and configuration.

Vascular: No hyperdense vessel or unexpected calcification.

Skull: Calvarium is unremarkable.

Sinuses/Orbits: No acute finding.

Other: None.

Review of the MIP images confirms the above findings

CTA NECK

Aortic arch: Great vessel origins are patent.

Right carotid system: Patent. No measurable stenosis or evidence
dissection.

Left carotid system: Patent. No measurable stenosis or evidence of
dissection.

Vertebral arteries: Patent and codominant. No measurable stenosis or
evidence of dissection.

Skeleton: Degenerative changes of the cervical spine, greatest at
C5-C6 and C6-C7.

Other neck: No mass or adenopathy.

Upper chest: No apical lung mass.

Review of the MIP images confirms the above findings

CTA HEAD

Anterior circulation: Intracranial internal carotid arteries are
patent. Anterior and middle cerebral arteries are patent.

Posterior circulation: Intracranial vertebral arteries are patent.
Basilar artery is patent. Posterior cerebral arteries are patent.
Probable small caliber left posterior communicating artery.

Venous sinuses: As permitted by contrast timing, patent.

Review of the MIP images confirms the above findings
IMPRESSION: No acute intracranial abnormality.

No large vessel occlusion, hemodynamically significant stenosis, or
evidence of dissection.
